# Patient Record
Sex: Female | Born: 1985 | Race: White | Hispanic: No | Marital: Married
Health system: Southern US, Academic
[De-identification: ages and names within clinical notes are randomized; demographics above are authoritative.]

---
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
## 2015-11-19 IMAGING — CR XRAY LOWER LEG LT
1 series · 2 of 2 positions shown · non-contrast
Comparison: None.

Exam:   

Left lower leg 2V
INDICATION: Pain.

[Series 1: view not recorded · 0.17mm/px · 2 of 2 slices shown]
[im 1/2]
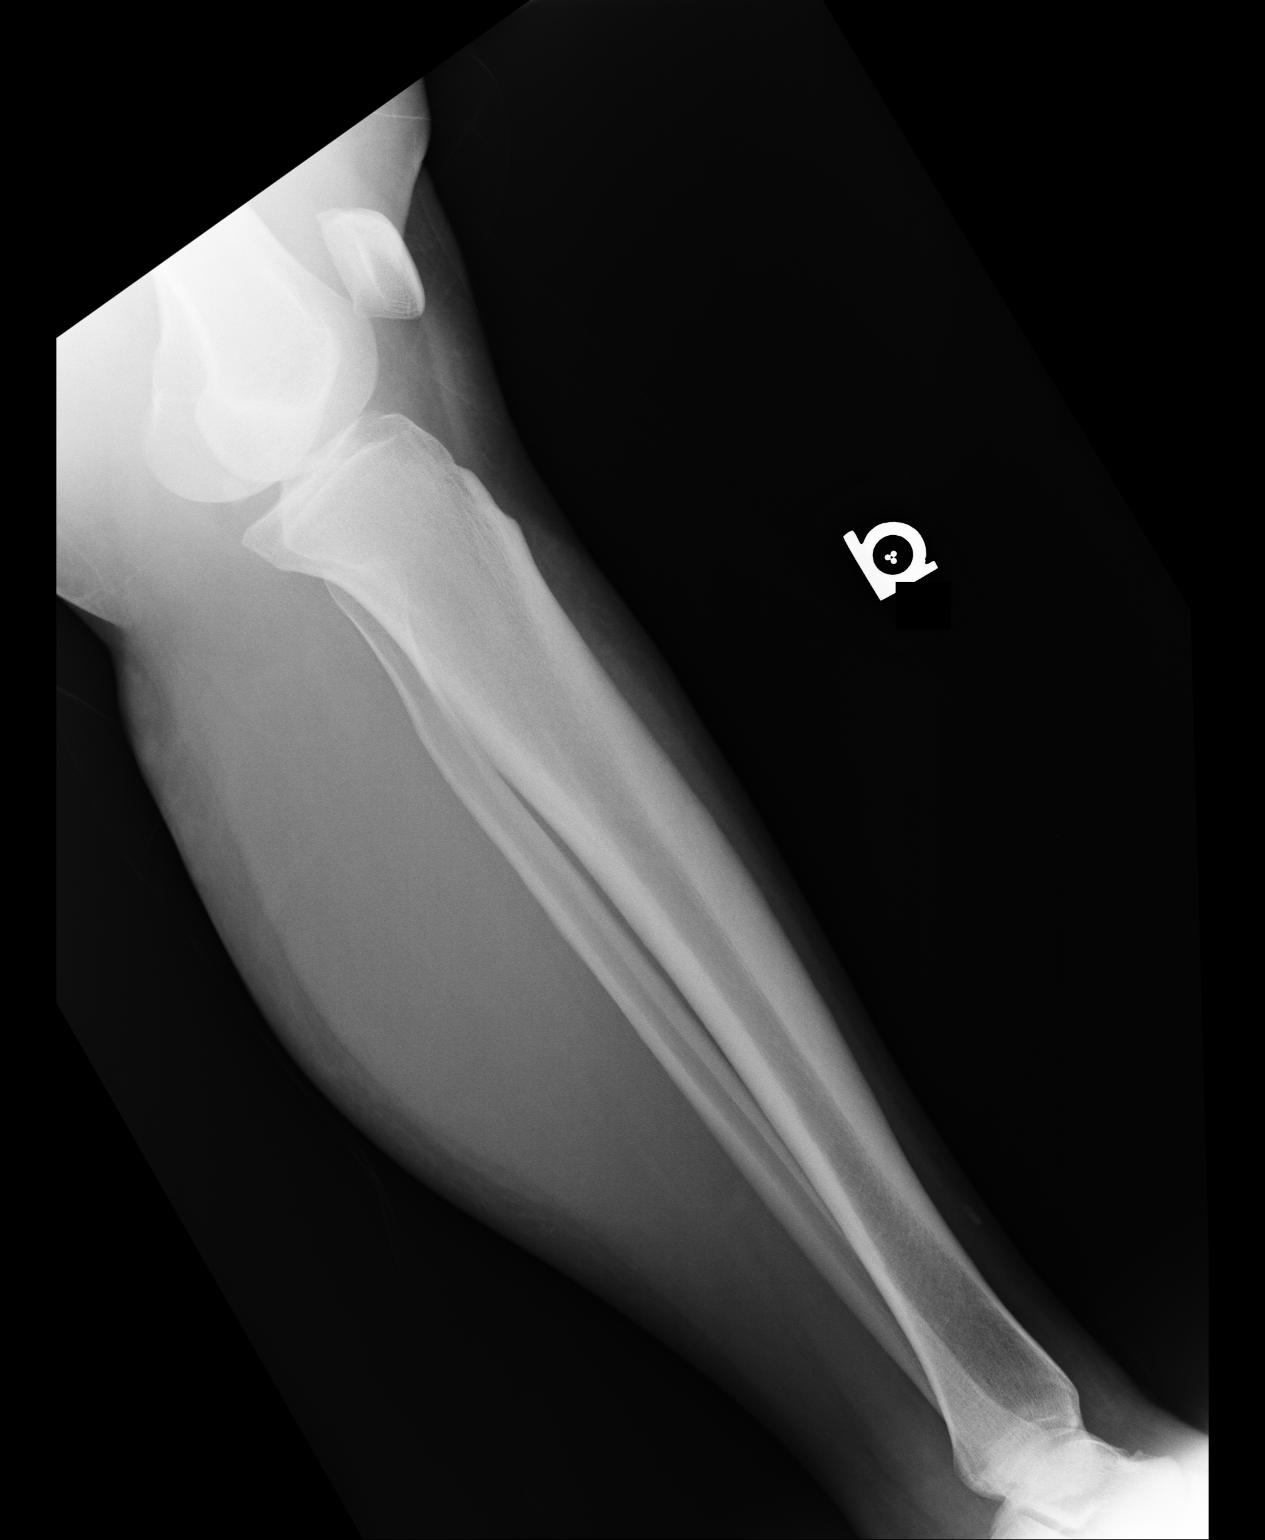
[im 2/2]
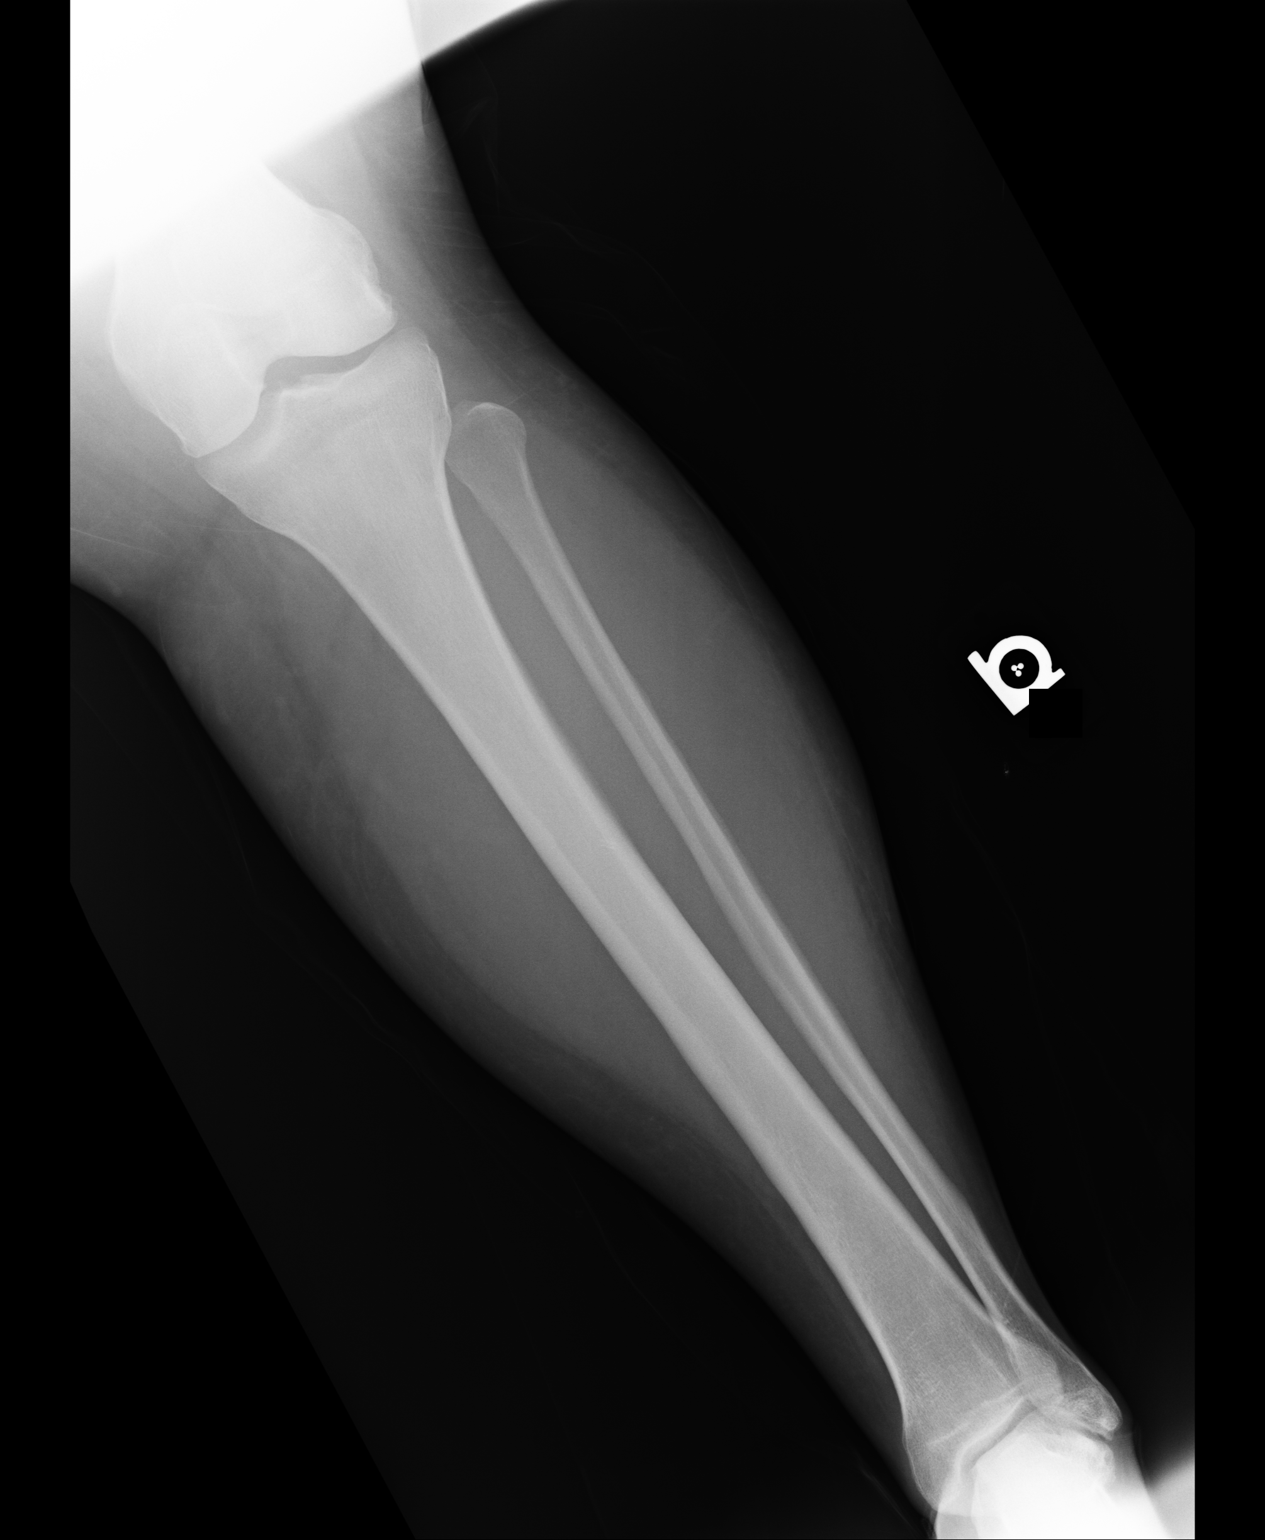

[2 of 2 positions shown; findings below may reference images not displayed]

FINDINGS: The joint spaces are preserved. No fractures are seen.
IMPRESSION: Negative left tibia fibula.

## 2015-11-19 IMAGING — CR XRAY KNEE 3 VIEWS LT
1 series · 3 of 3 positions shown · non-contrast
Comparison: None.

Exam:   

Left knee 4V
INDICATION: Pain.

[Series 3: view not recorded · 0.17mm/px · 3 of 3 slices shown]
[im 1/3]
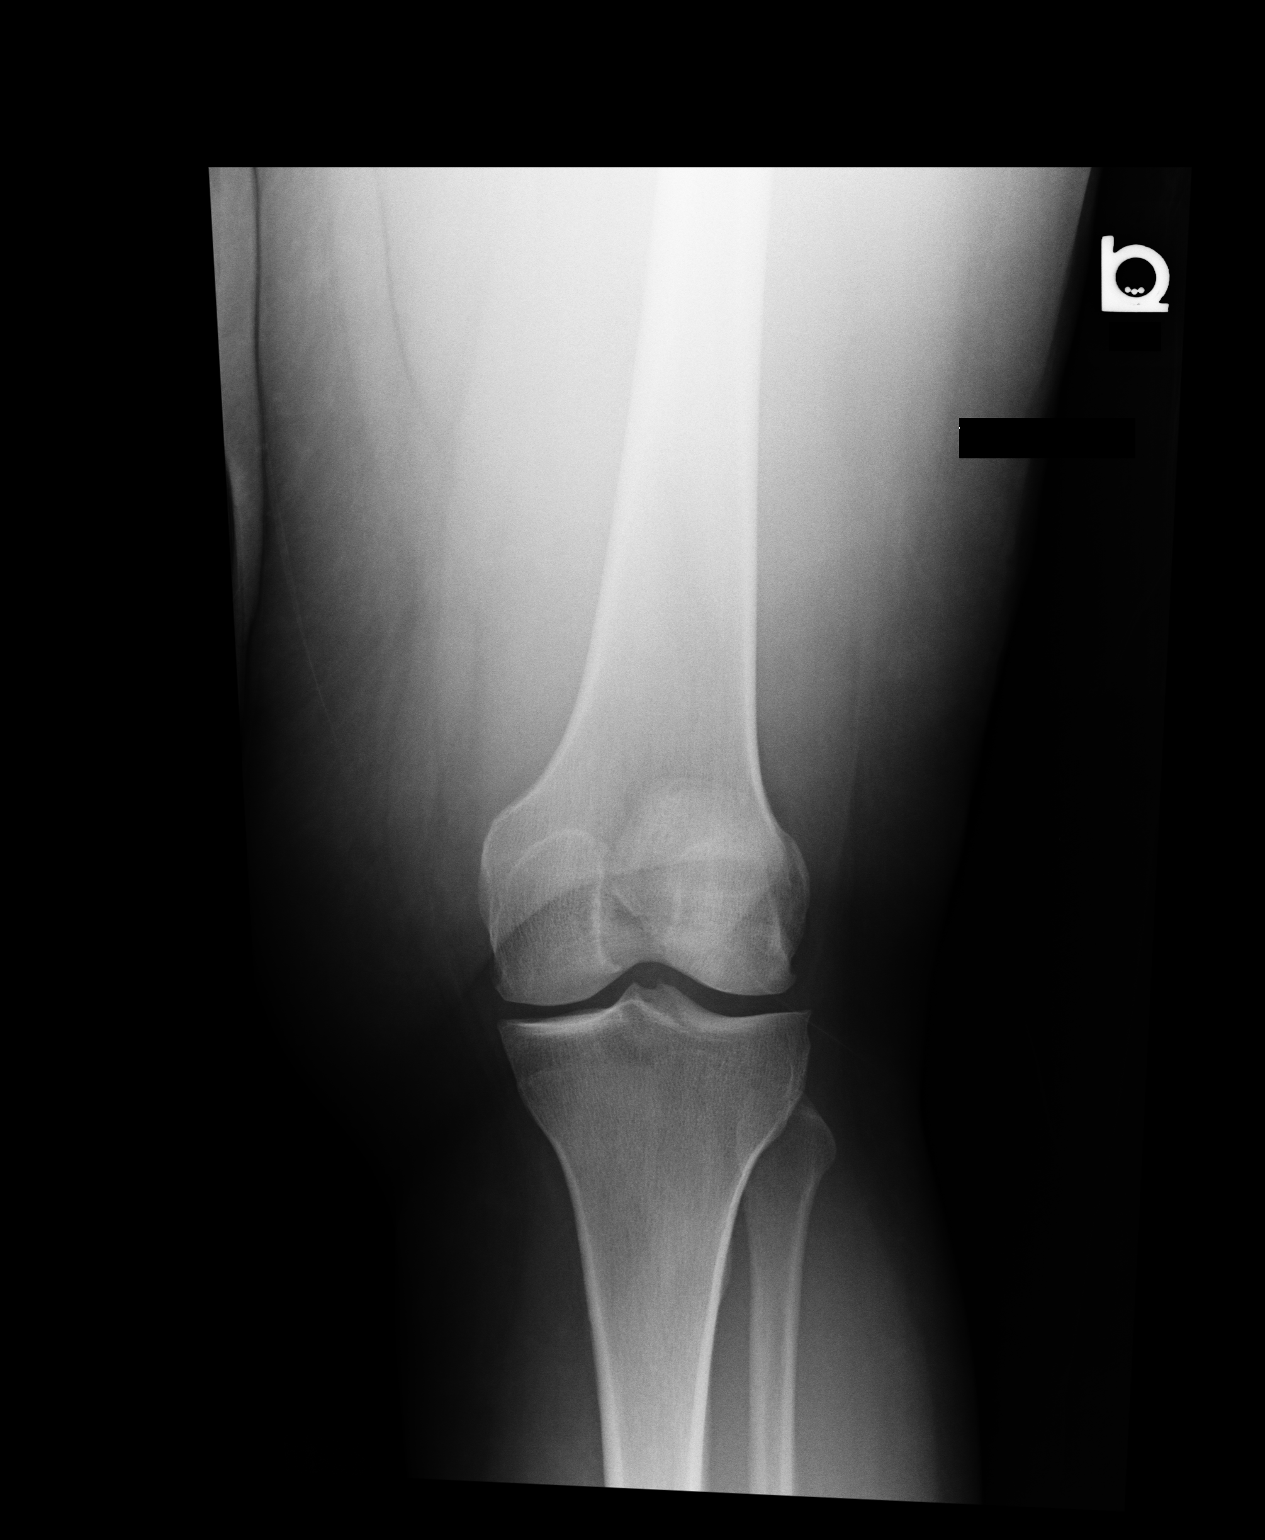
[im 2/3]
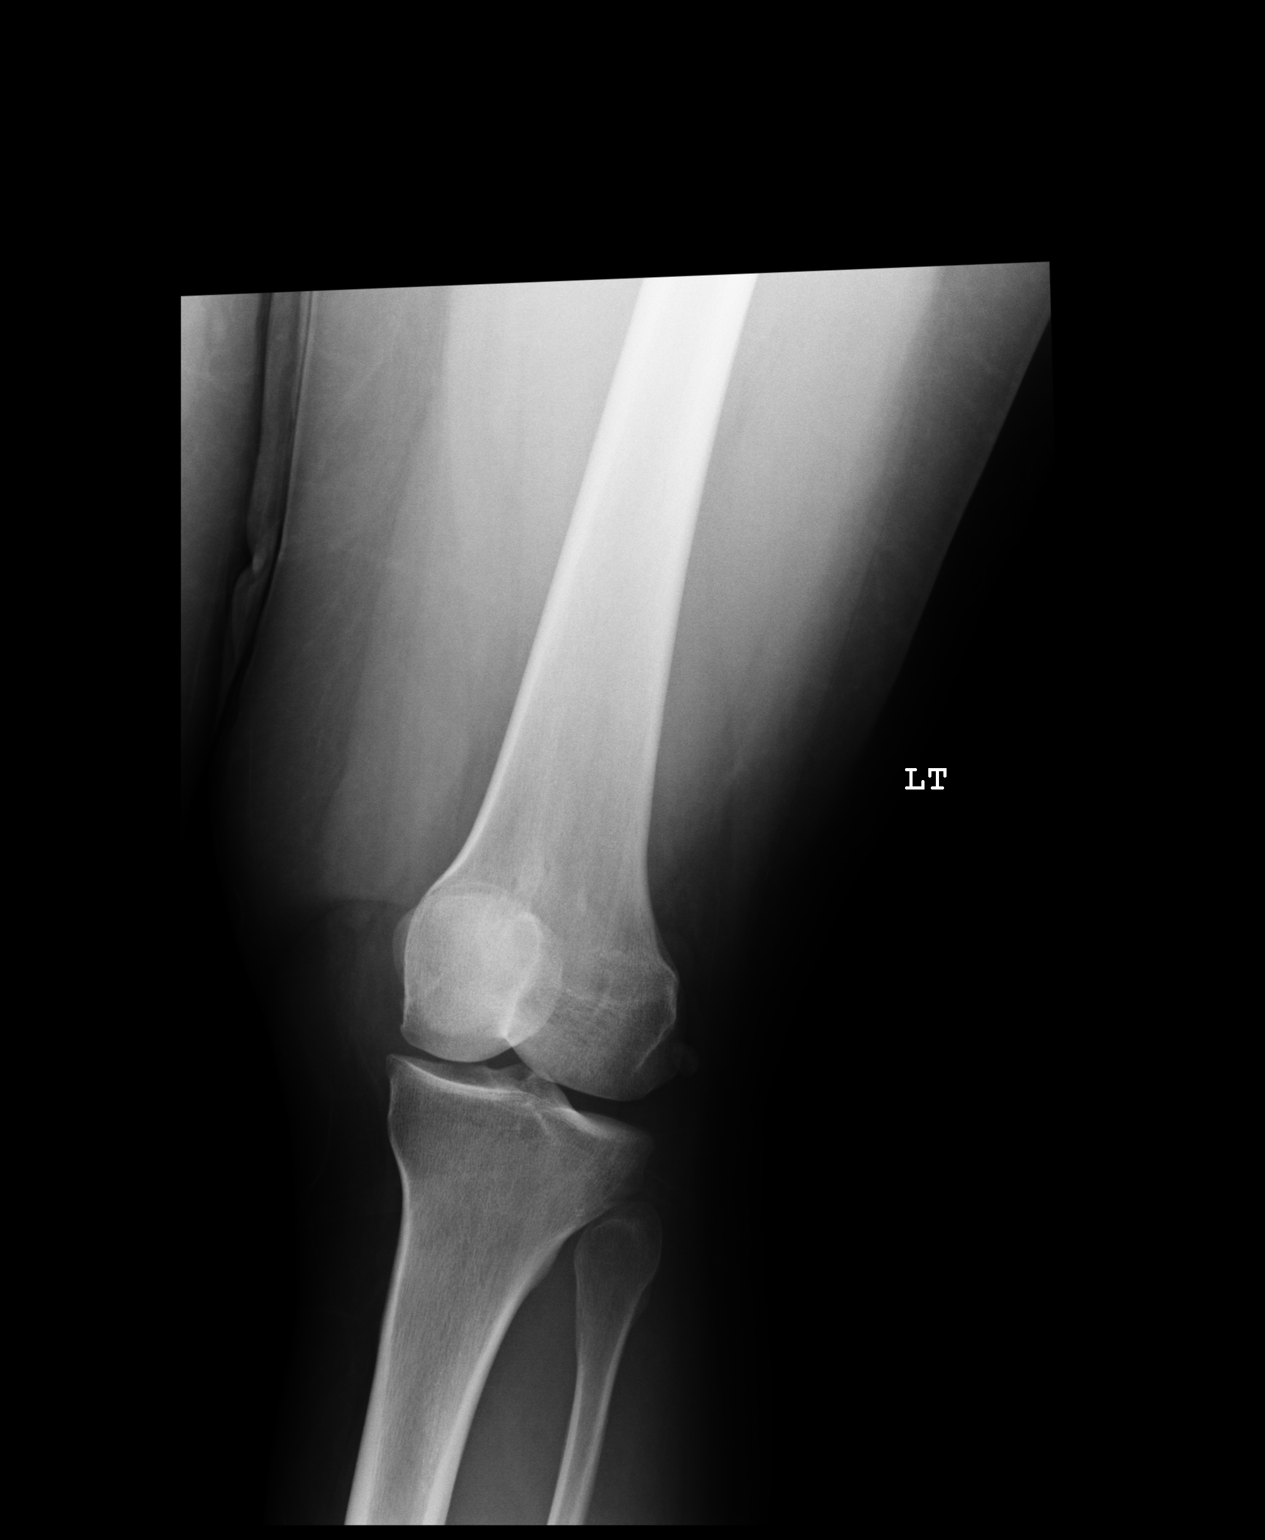
[im 3/3]
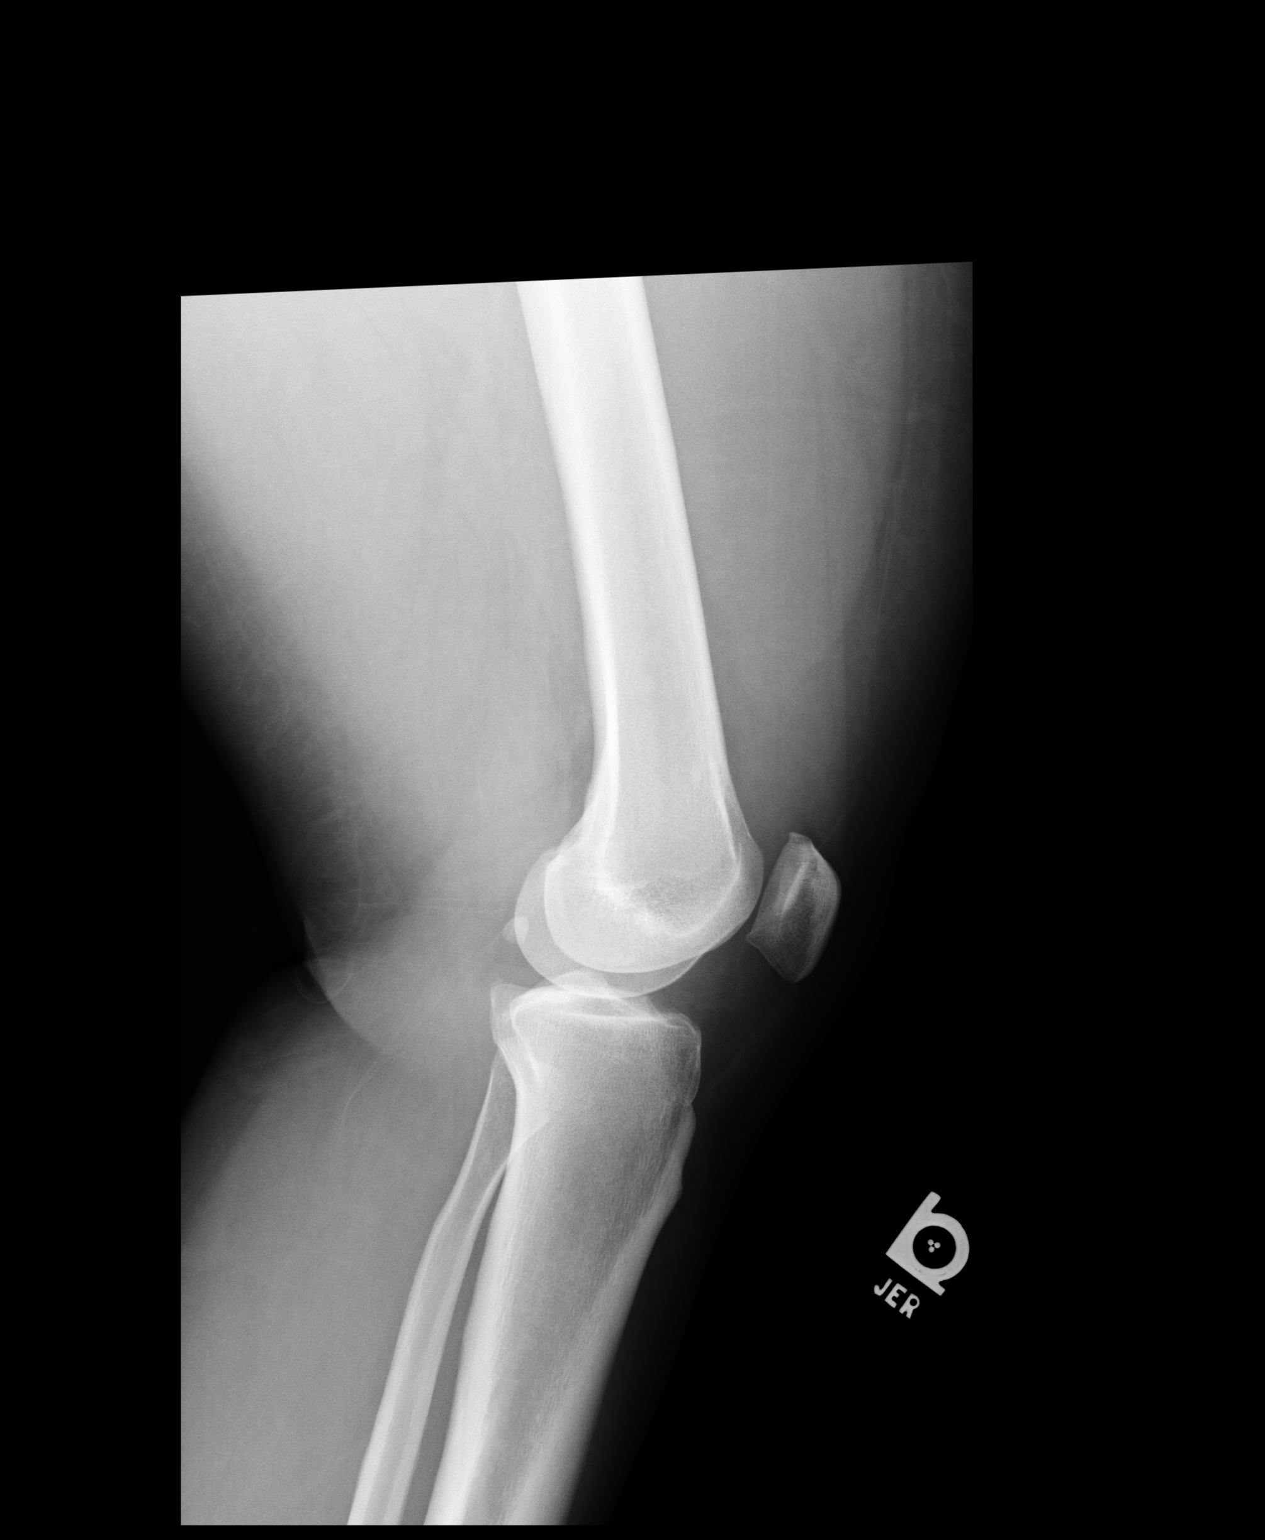

[3 of 3 positions shown; findings below may reference images not displayed]

FINDINGS: The knee is seen in multiple views. No fracture is seen. No joint effusion is appreciated. The joint space is preserved.
IMPRESSION: Negative left knee.

## 2018-04-22 IMAGING — MR MRI BRAIN WITHOUT CONTRAST
8 of 10 series · 32 of 48 positions shown · non-contrast
Comparison: None.

EXAM:  MRI BRAIN WITHOUT CONTRAST
INDICATION: Right leg weakness.
TECHNIQUE: An MRI of the patient’s brain is performed with the acquisition of multiple images in the axial, sagittal and coronal planes.  T1 and T2-weighted scanning protocols are utilized.

[Series 9: DWI · axial · 5.0mm · 1.35mm/px · z∈[-67,+59]mm · 8 of 88 slices shown (1 of 3)]
[im 1/88]
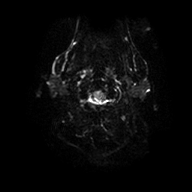
[im 10/88]
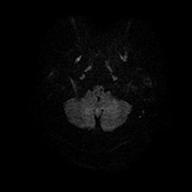
[im 30/88]
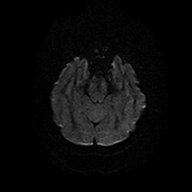
[im 39/88]
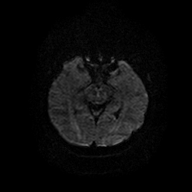
[im 49/88]
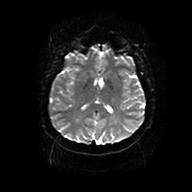
[im 59/88]
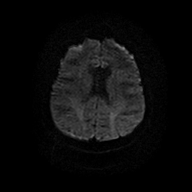
[im 78/88]
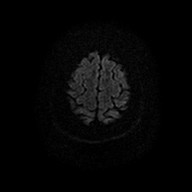
[im 88/88]
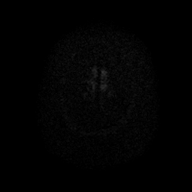

[Series 10: DWI · axial · 5.0mm · 1.35mm/px · z∈[-67,+59]mm · 2 of 22 slices shown (2 of 3)]
[im 1/22]
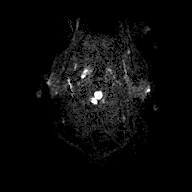
[im 22/22]
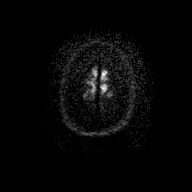

[Series 11: DWI · axial · 5.0mm · 1.35mm/px · z∈[-67,+59]mm · 3 of 22 slices shown (3 of 3)]
[im 1/22]
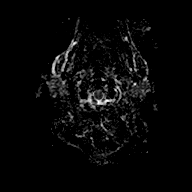
[im 11/22]
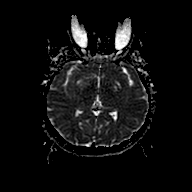
[im 22/22]
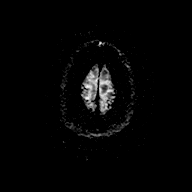

[Series 12: FLAIR · sagittal · 5.0mm · 0.75mm/px · 4 of 28 slices shown (1 of 2)]
[im 1/28]
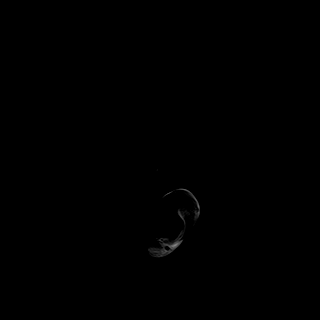
[im 10/28]
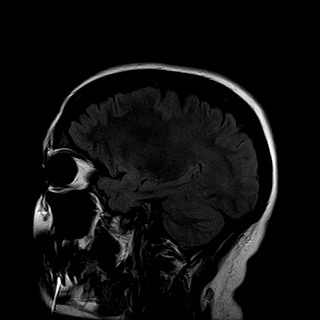
[im 19/28]
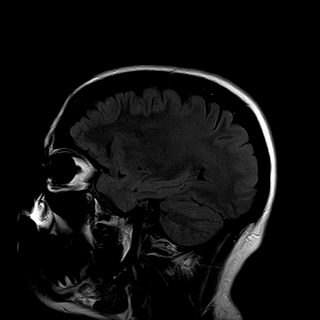
[im 28/28]
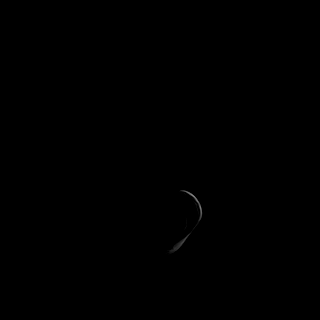

[Series 13: PD · axial · 4.0mm · 0.43mm/px · 1 of 36 slices shown]
[im 1/36]
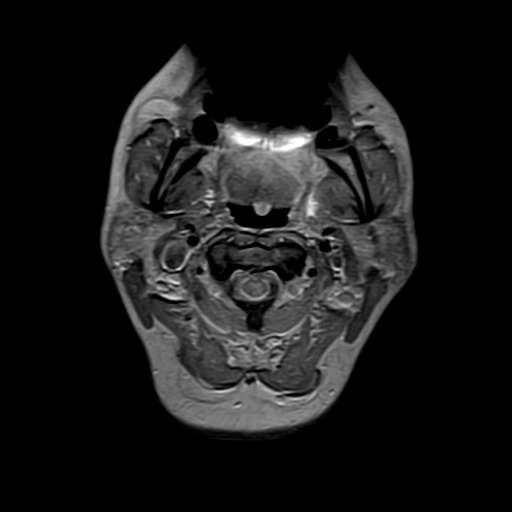

[Series 17: FLAIR · axial · 4.0mm · 0.43mm/px · z∈[-74,+79]mm · 5 of 36 slices shown (2 of 2)]
[im 1/36]
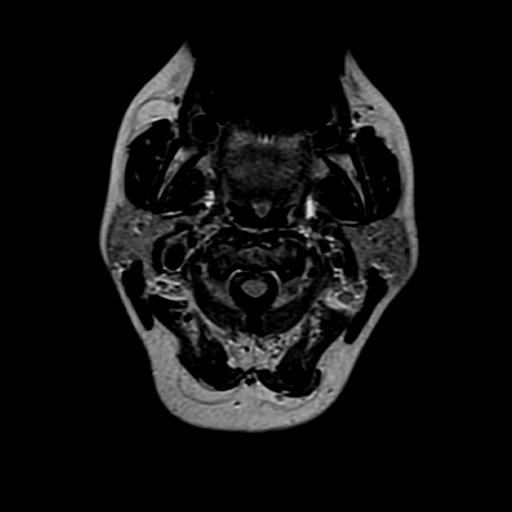
[im 9/36]
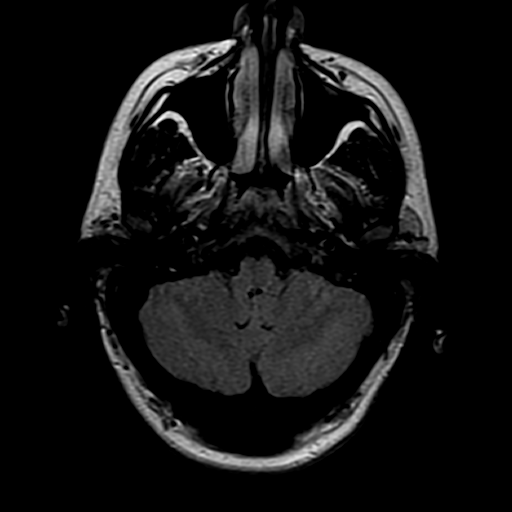
[im 18/36]
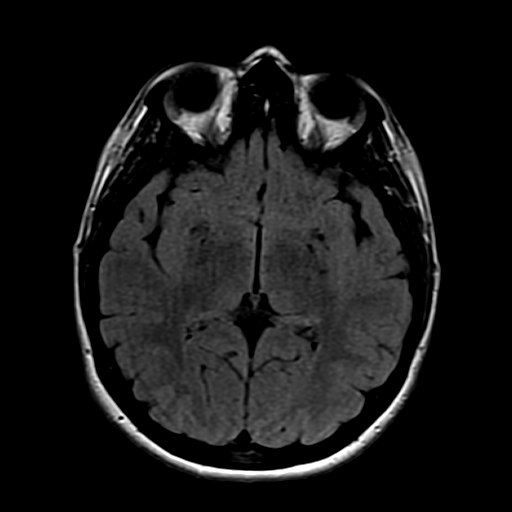
[im 27/36]
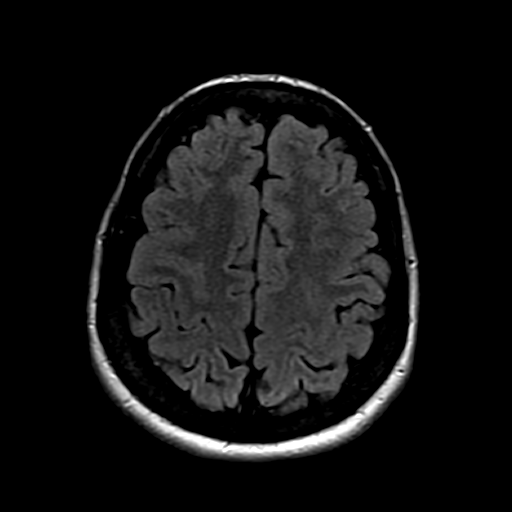
[im 36/36]
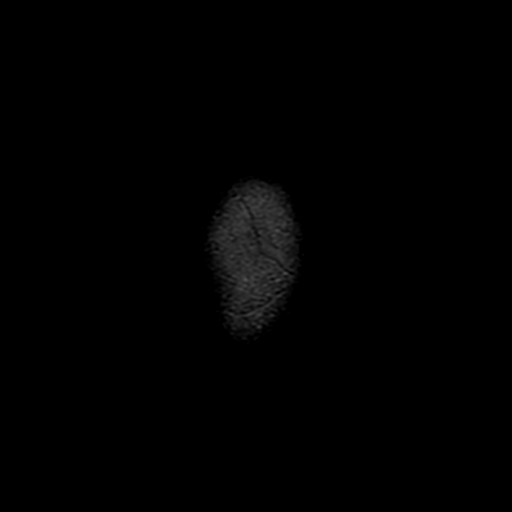

[Series 19: T1 · axial · 4.0mm · 0.43mm/px · z∈[-74,+79]mm · 5 of 36 slices shown]
[im 1/36]
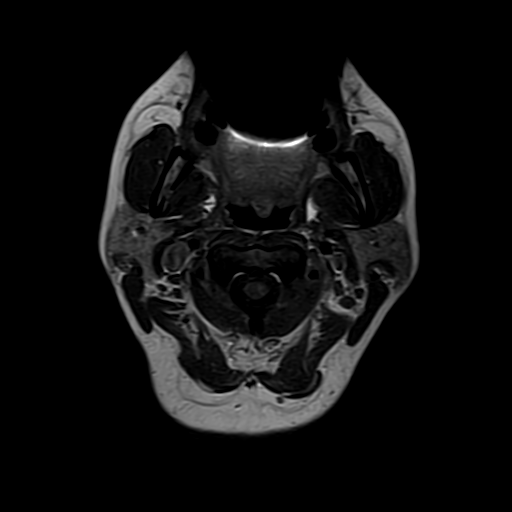
[im 9/36]
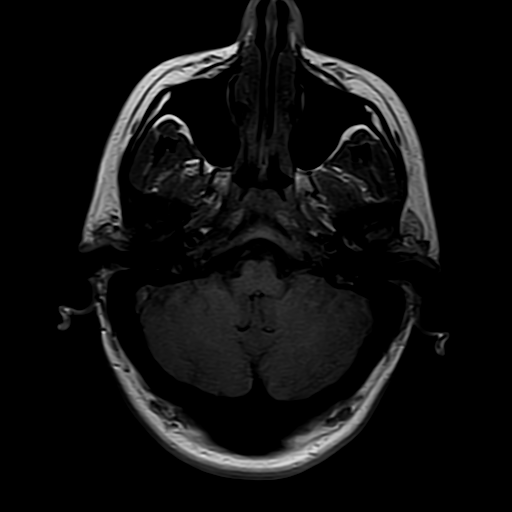
[im 18/36]
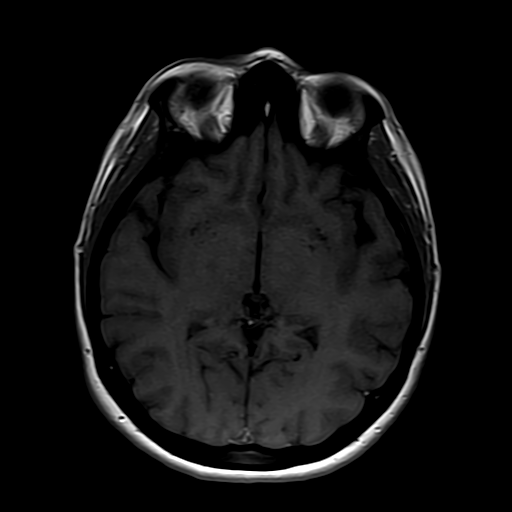
[im 27/36]
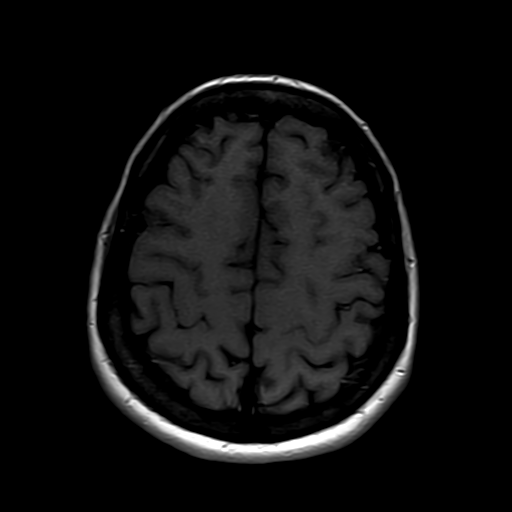
[im 36/36]
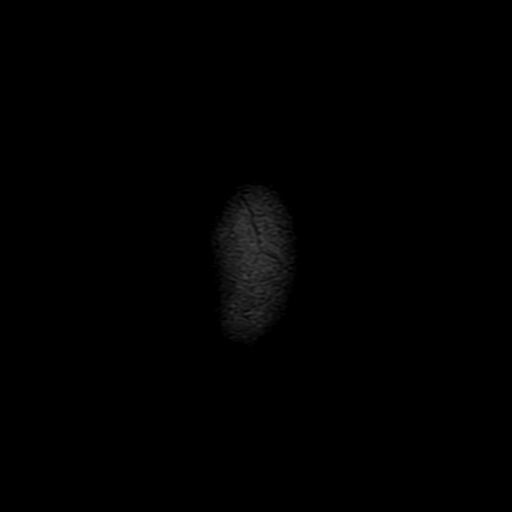

[Series 20: T2 · coronal · 5.0mm · 0.43mm/px · 4 of 28 slices shown]
[im 1/28]
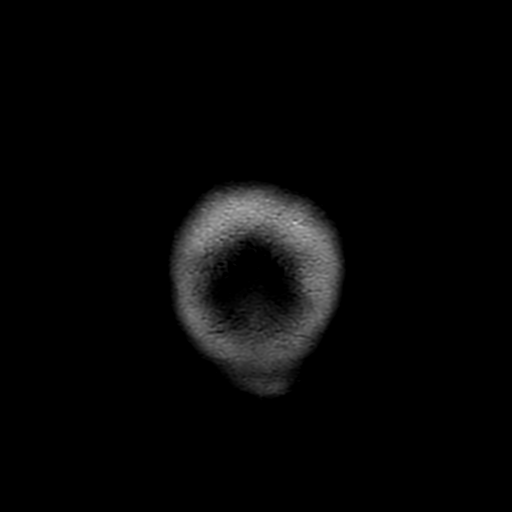
[im 10/28]
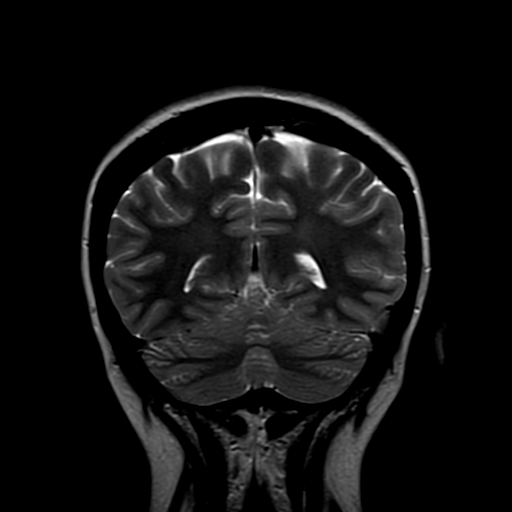
[im 19/28]
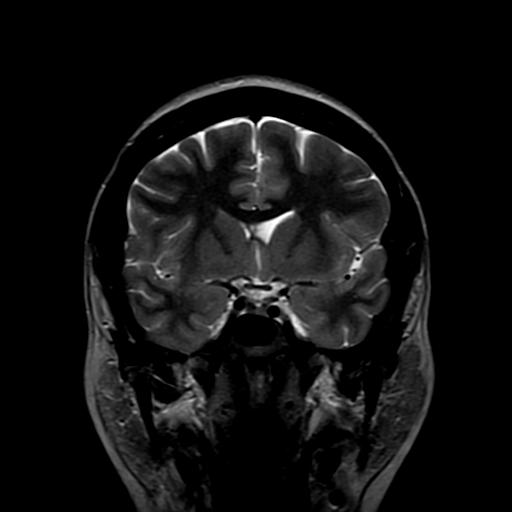
[im 28/28]
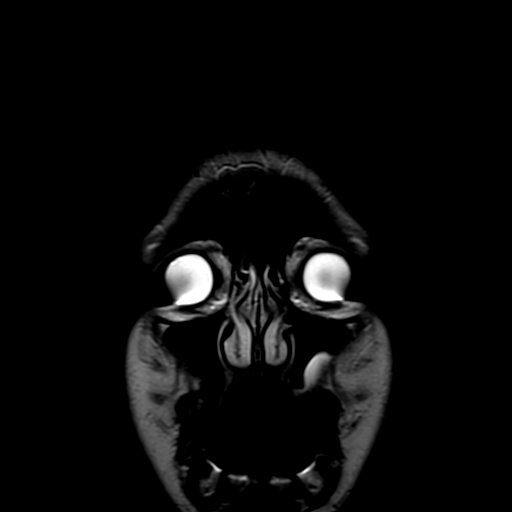

[32 of 48 positions shown; findings below may reference images not displayed]

FINDINGS: The sagittal images show no evidence for atrophy of the corpus callosum.  The cerebellar tonsils are above the level of the foramen magnum.  Diffusion-weighted imaging shows no evidence for restricted diffusion.  The T1 and T2-weighted sagittal, axial and coronal images show no evidence for gliosis in the deep white matter tracts.  There are focal areas in the basal ganglia bilaterally that may represent lacunar infarcts.  No mass effect or midline shift is noted.  The orbits appear unremarkable and symmetric.  An oval lesion is seen in the left maxillary sinus that has a fluid signal and this is compatible with a retention cyst or mucocele.
IMPRESSION: No evidence for restricted diffusion.

Focal lesion in the left maxillary sinus compatible with a mucocele or retention cyst. 

No abnormal signal intensities arising from the white or gray matter.  

No mass effect or midline shift.  

No atrophy of the corpus callosum.  

The internal auditory canals appear symmetric.

## 2019-03-30 IMAGING — CR XRAY LUMBAR SPINE COMPLETE
1 series · 5 of 5 positions shown · non-contrast
Comparison: Radiographs dated 02/06/2014.

EXAM:  XRAY LUMBAR SPINE COMPLETE
INDICATION: Lower back pain.

[Series 3: view not recorded · 0.17mm/px · 5 of 5 slices shown]
[im 1/5]
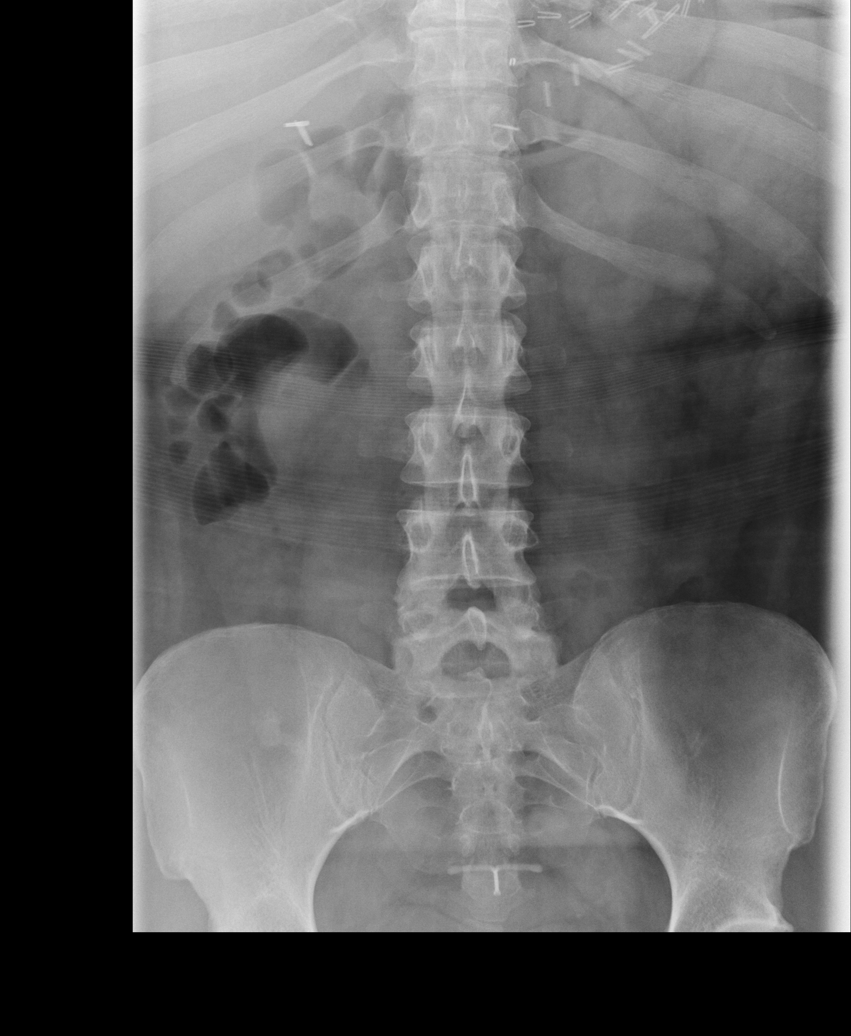
[im 2/5]
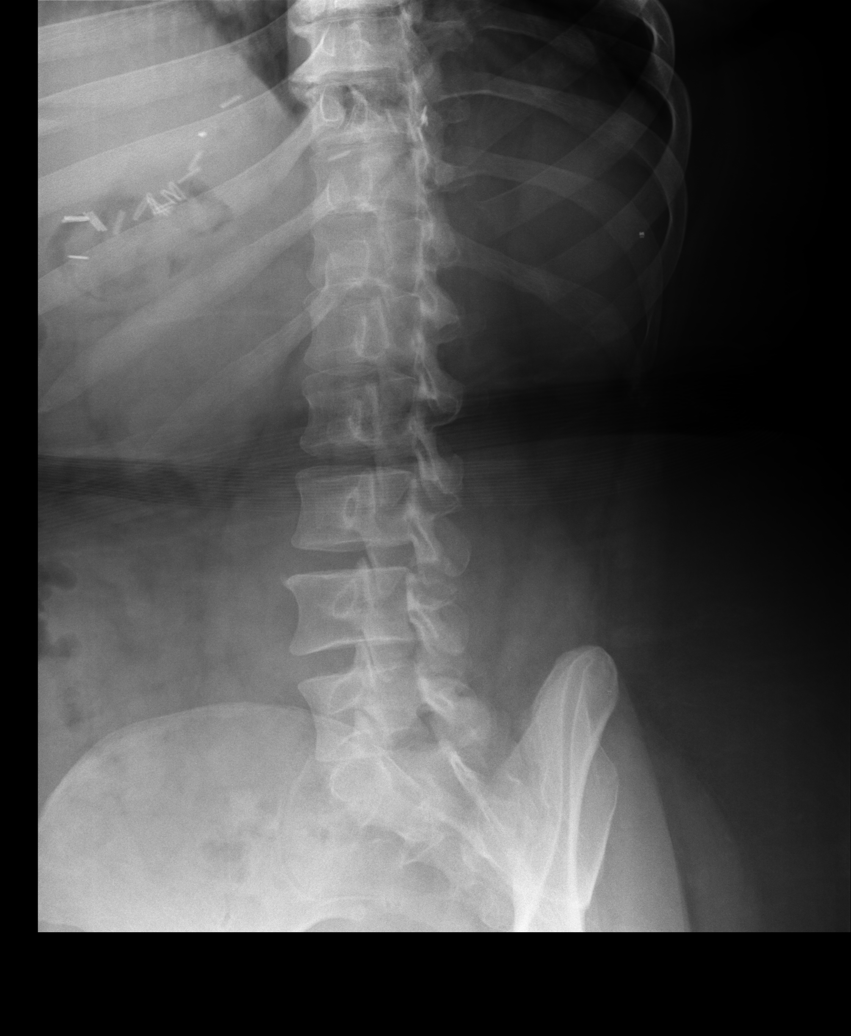
[im 3/5]
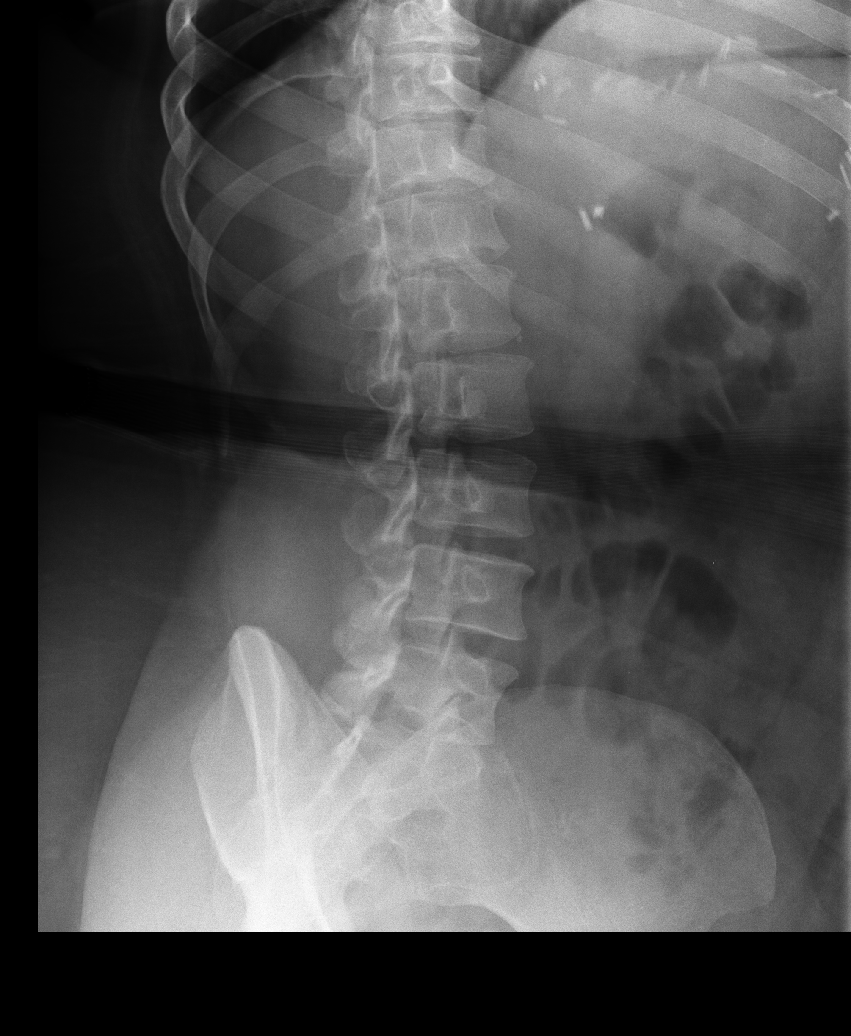
[im 4/5]
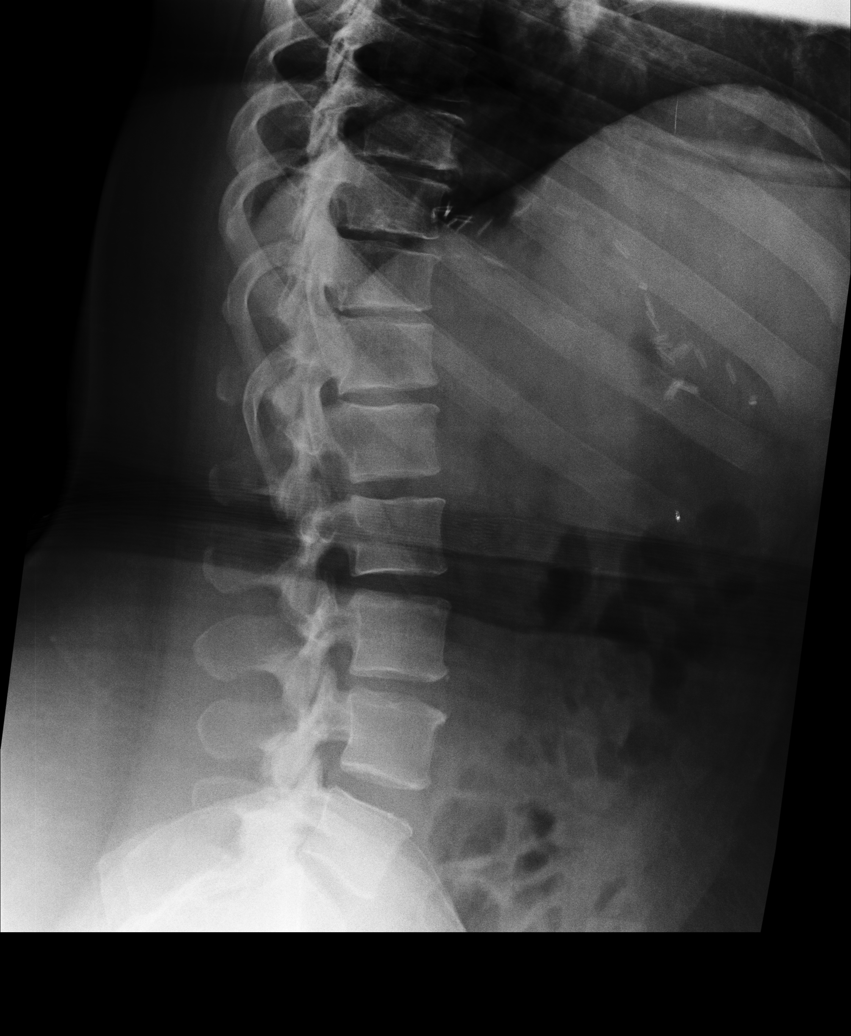
[im 5/5]
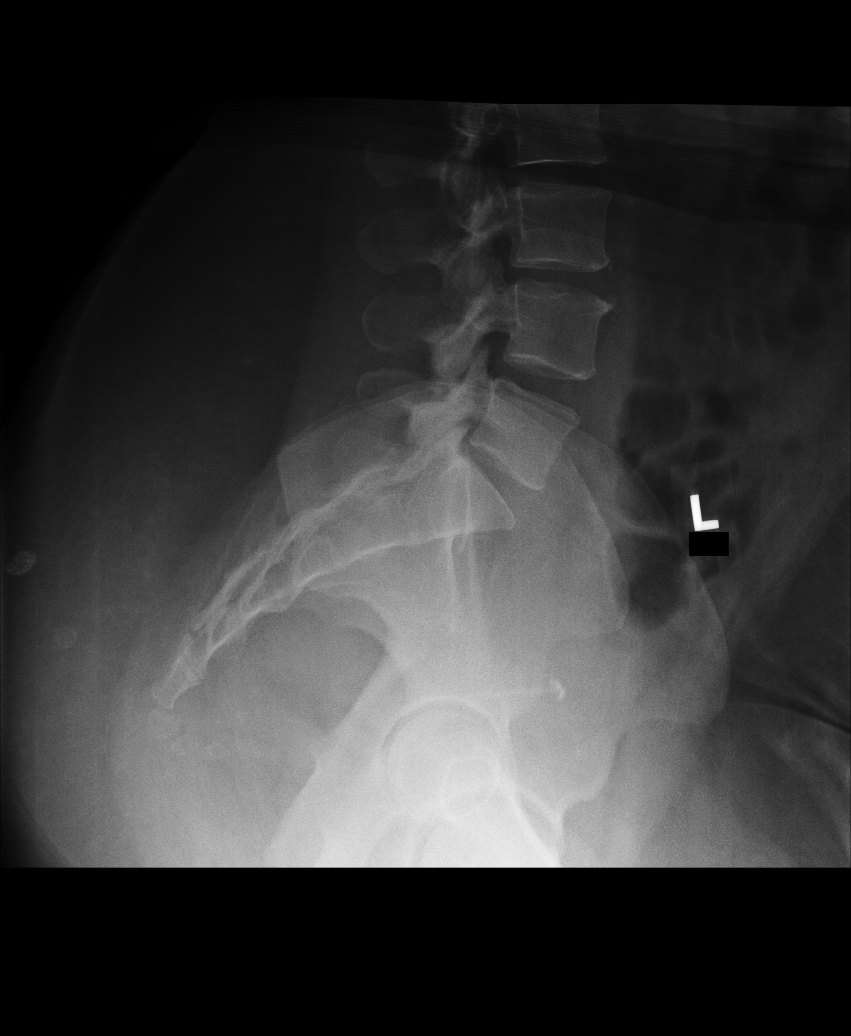

[5 of 5 positions shown; findings below may reference images not displayed]

FINDINGS: Vertebral bodies are normal in height and alignment. There is no acute fracture or subluxation. Mild degenerative disc disease at L3-4 level is again noted. There is also mild facet arthropathy within the lower lumbar spine. Paraspinal soft tissues are unremarkable. Surgical staples within the upper abdomen are again identified.
IMPRESSION: Stable mild degenerative changes.

## 2019-03-30 IMAGING — CR XRAY HIP W/PELVIS BIL 2 VIEWS
1 series · 3 of 3 positions shown · non-contrast
Comparison: Pelvis radiograph dated 02/06/2014.

EXAM:  XRAY HIP W/PELVIS BIL 2 VIEWS
INDICATION: Bilateral hip pain.

[Series 1: view not recorded · 0.17mm/px · 3 of 3 slices shown]
[im 1/3]
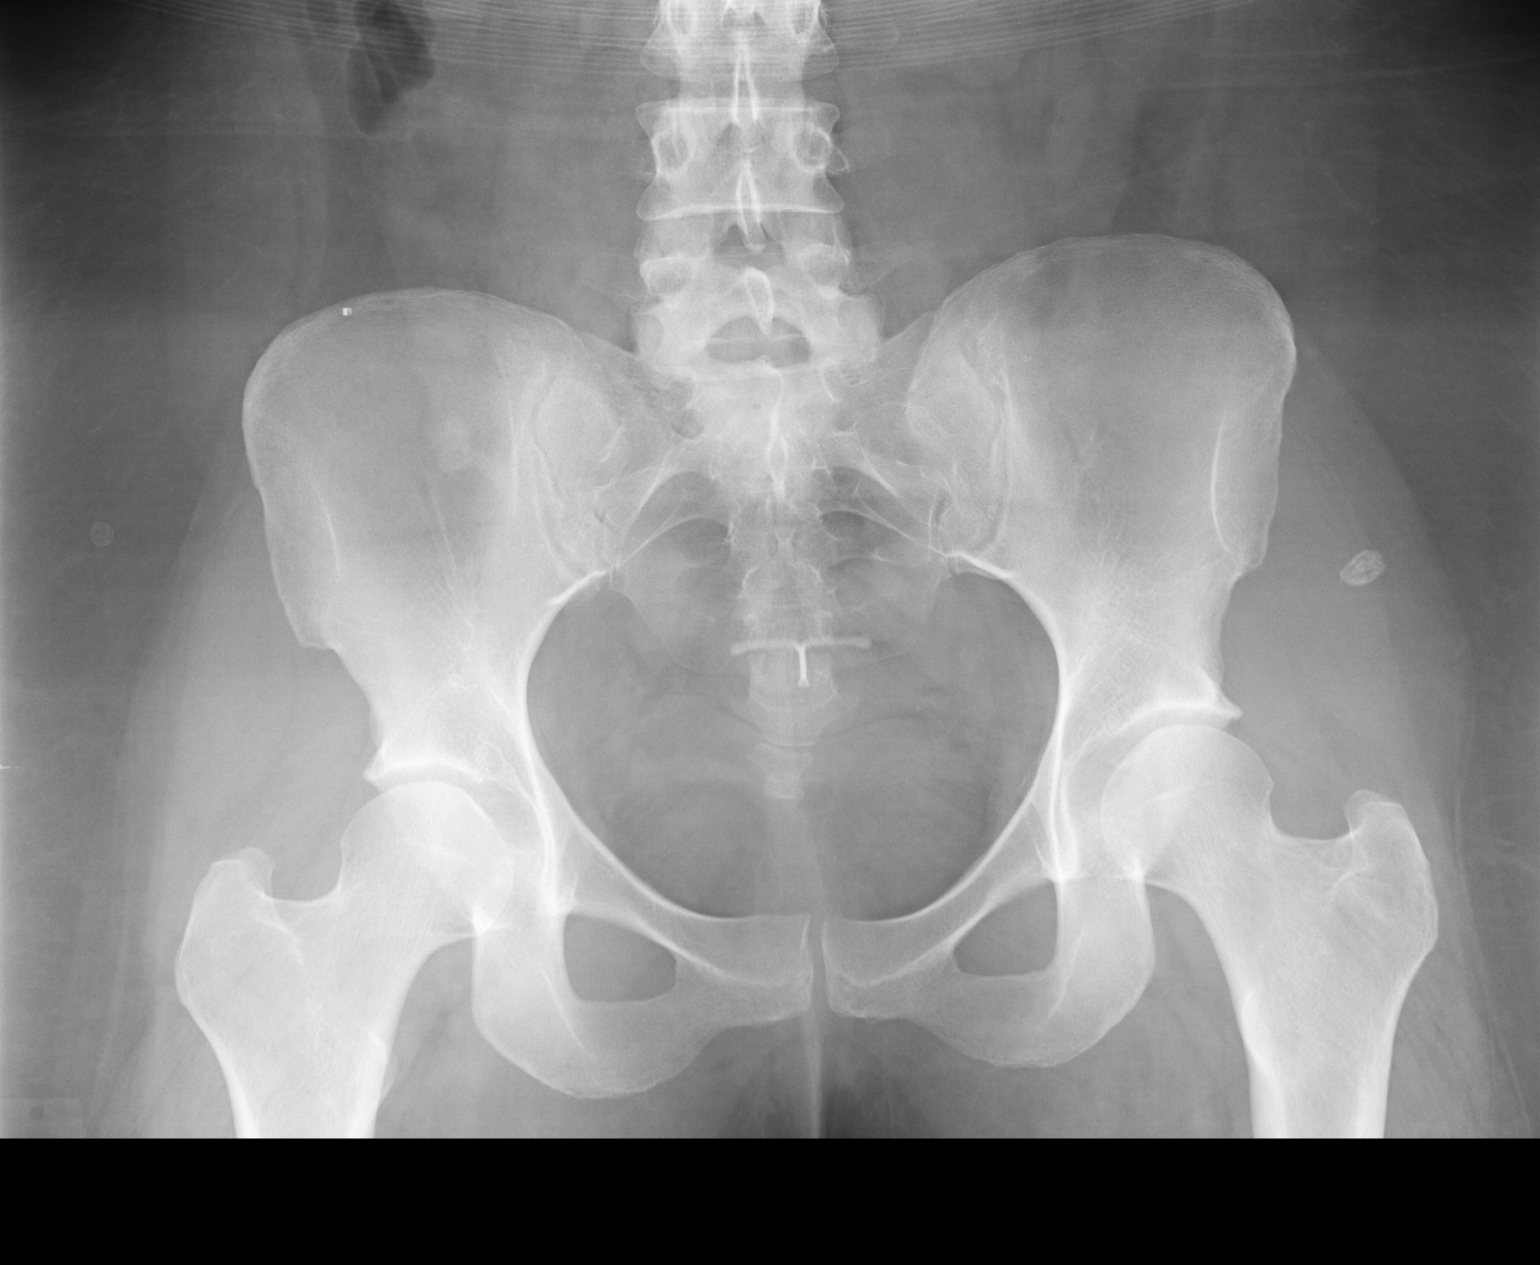
[im 2/3]
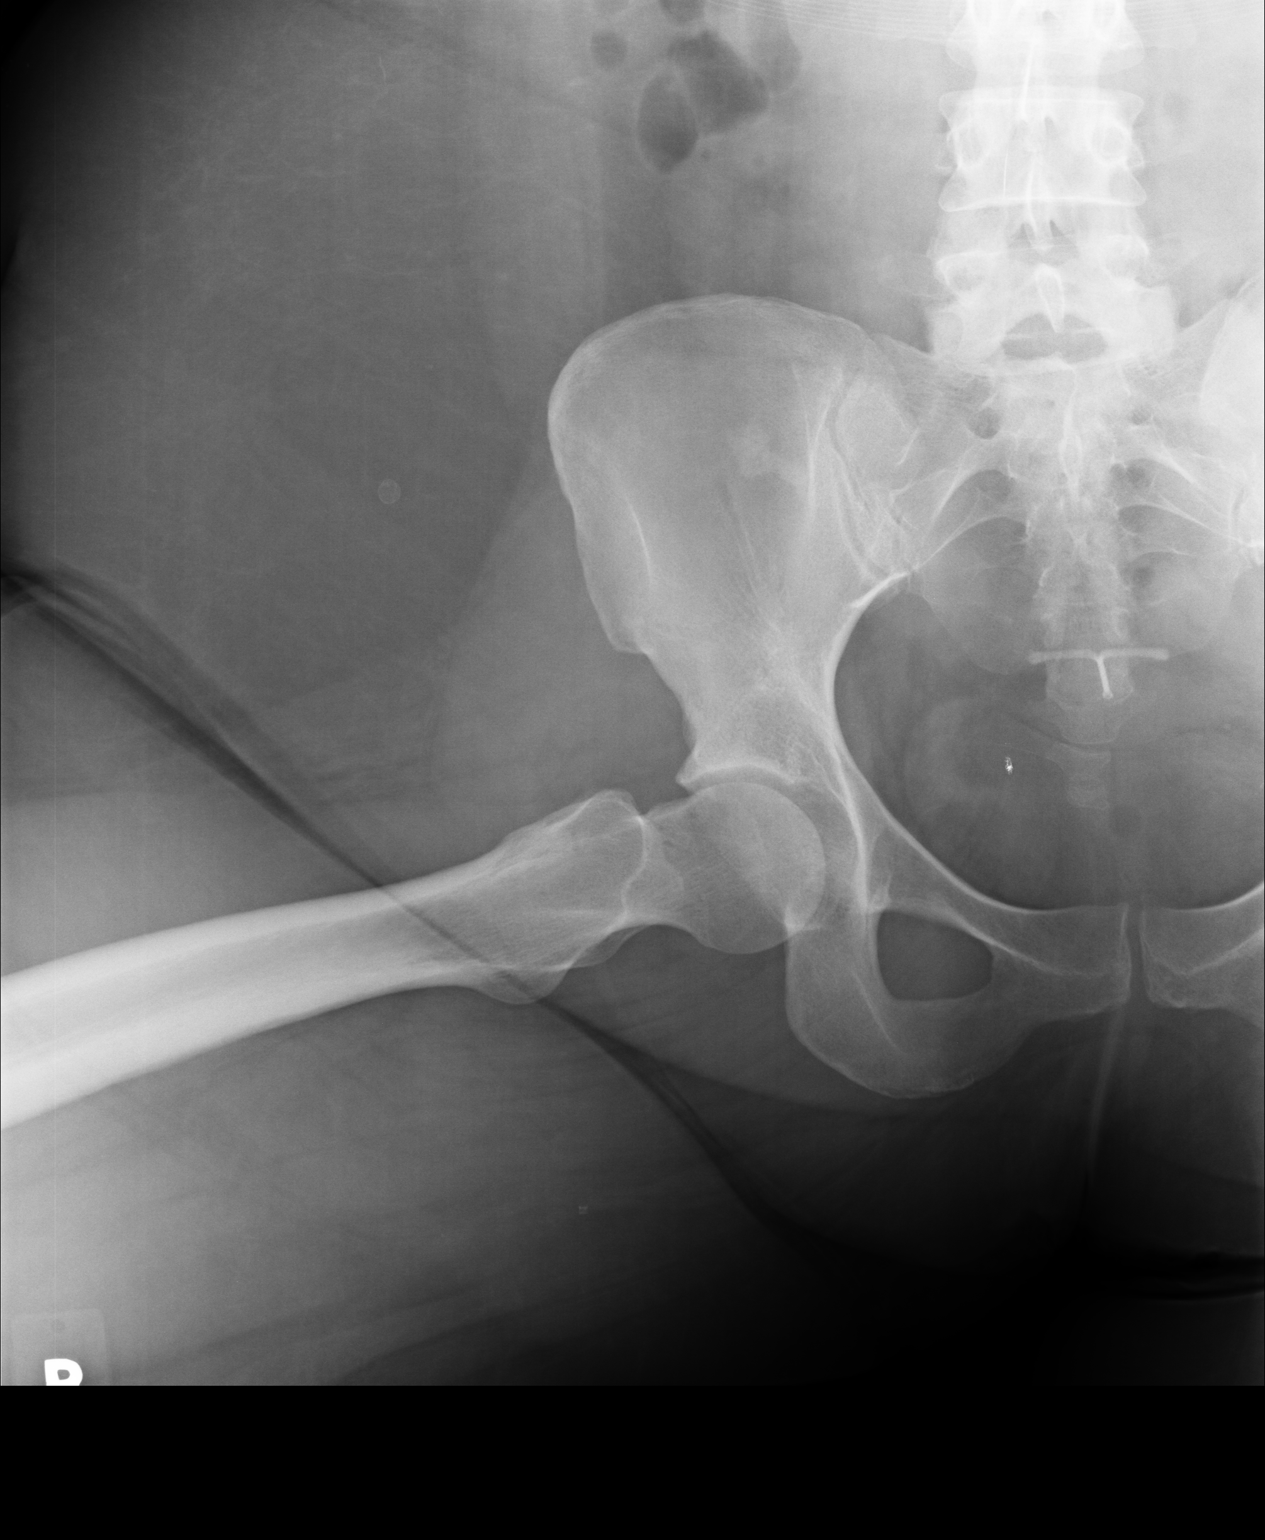
[im 3/3]
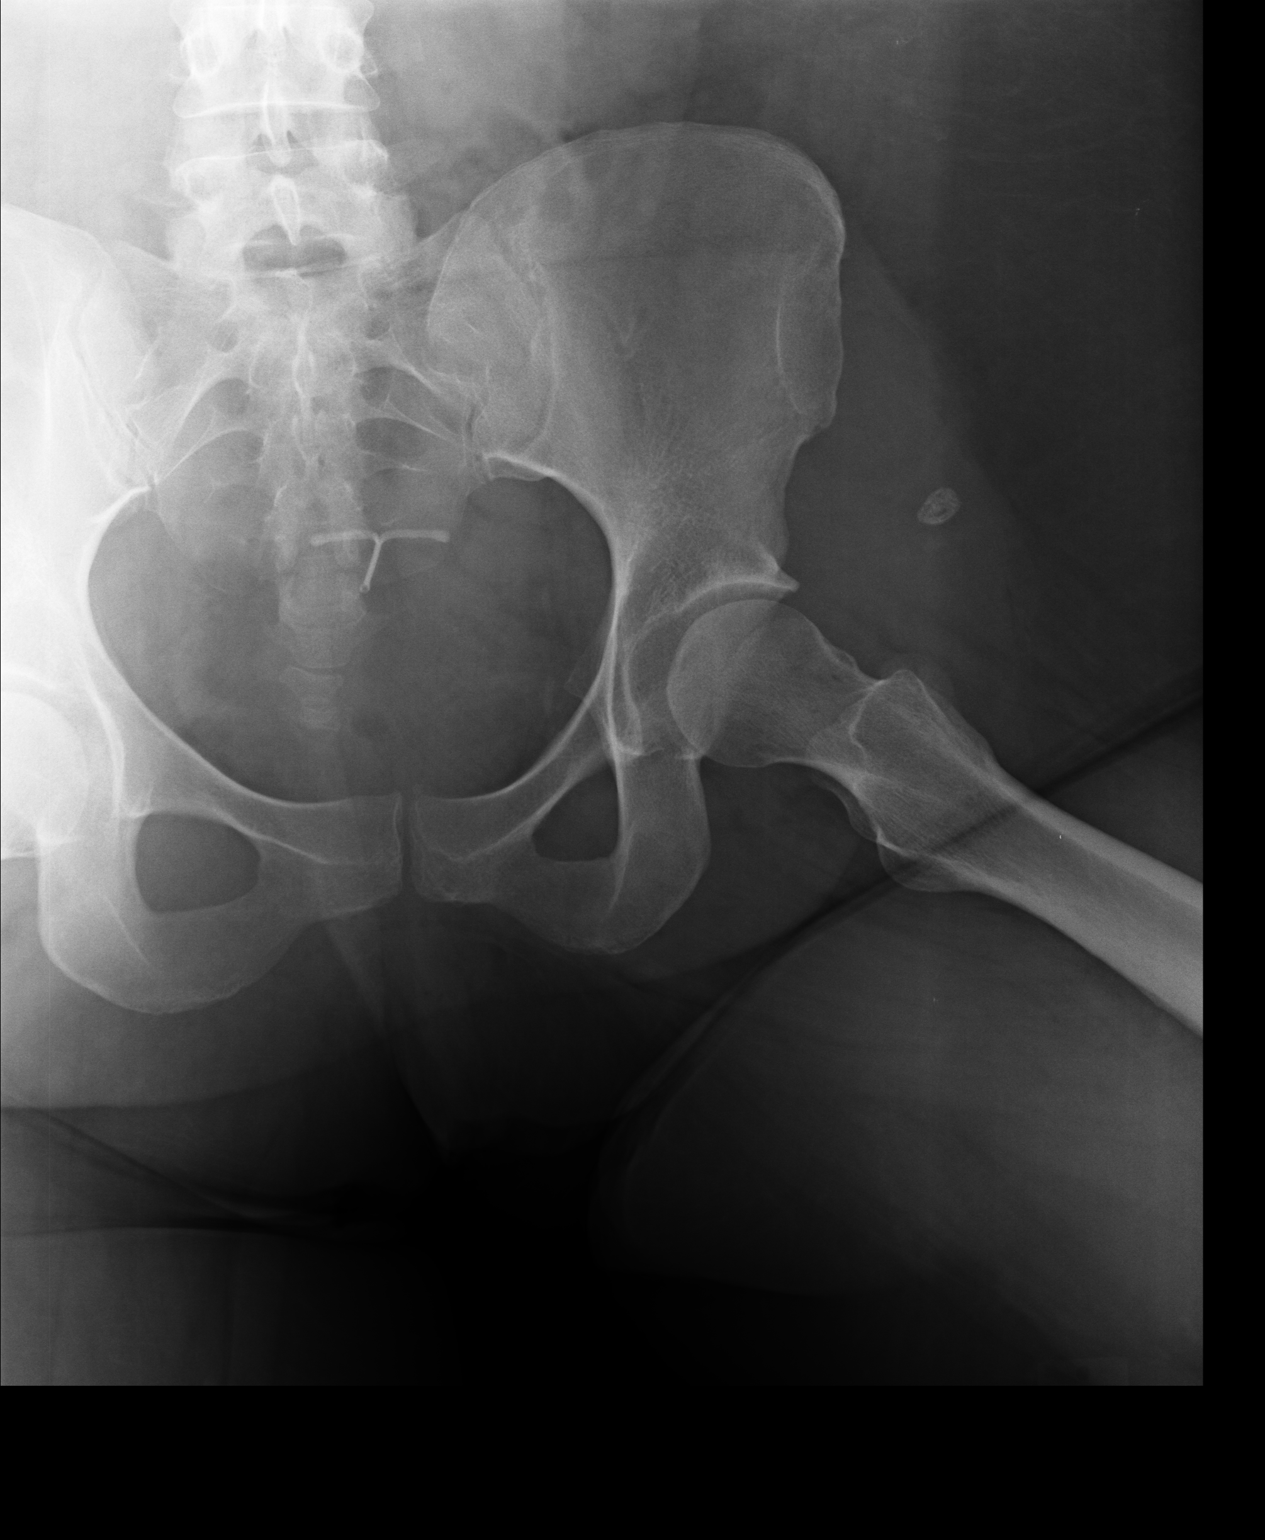

[3 of 3 positions shown; findings below may reference images not displayed]

FINDINGS: There is no acute fracture or subluxation. No significant arthritic changes are seen. Intrauterine contraceptive device is noted.
IMPRESSION: Unremarkable exam.

## 2021-04-21 ENCOUNTER — Other Ambulatory Visit (INDEPENDENT_AMBULATORY_CARE_PROVIDER_SITE_OTHER): Payer: Self-pay | Admitting: Family

## 2021-04-21 ENCOUNTER — Other Ambulatory Visit: Payer: Self-pay

## 2021-04-21 ENCOUNTER — Inpatient Hospital Stay
Admission: RE | Admit: 2021-04-21 | Discharge: 2021-04-21 | Disposition: A | Discharge status: Home / Self Care | Payer: Managed Care, Other (non HMO) | Source: Ambulatory Visit | Attending: Family | Admitting: Family

## 2021-04-21 DIAGNOSIS — M25562 Pain in left knee: Secondary | ICD-10-CM

## 2021-04-23 ENCOUNTER — Other Ambulatory Visit (HOSPITAL_COMMUNITY): Payer: Self-pay | Admitting: Emergency Medicine

## 2021-04-23 DIAGNOSIS — M25562 Pain in left knee: Secondary | ICD-10-CM

## 2021-04-29 ENCOUNTER — Encounter (HOSPITAL_COMMUNITY): Payer: Self-pay

## 2021-04-30 IMAGING — MR MRI KNEE LT W/O CONTRAST
5 series · 37 of 40 positions shown · IV contrast (gadolinium)
Comparison: Radiographs dated 11/19/2015.

﻿EXAM:  [id number]   MRI KNEE LT W/O CONTRAST
INDICATION: Medial side pain.
TECHNIQUE: Multiplanar multisequential MRI of the left knee joint was performed without gadolinium contrast.

[Series 5: PD fat-sat · axial · left · 4.0mm · 0.37mm/px · z∈[-82,+31]mm · 7 of 26 slices shown (1 of 3)]
[im 1/26]
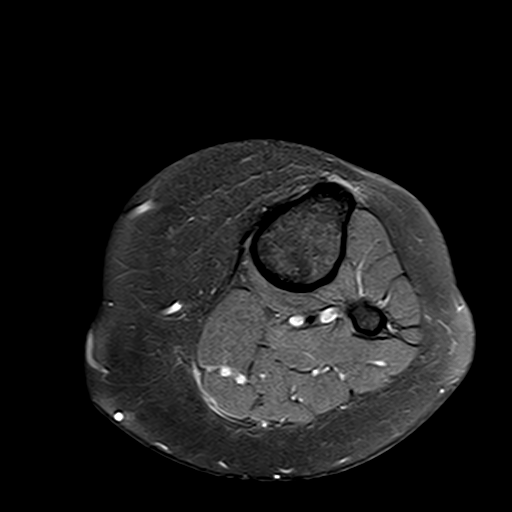
[im 5/26]
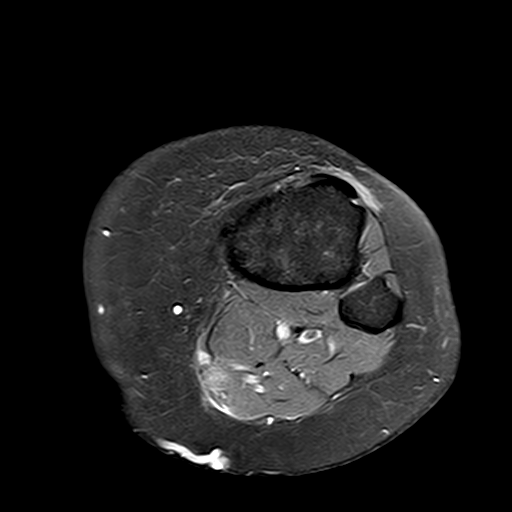
[im 9/26]
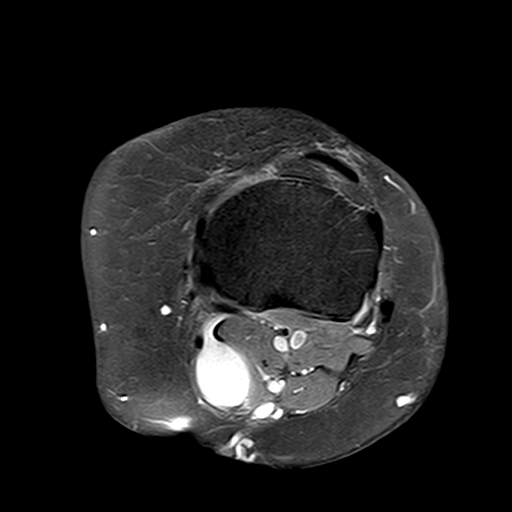
[im 13/26]
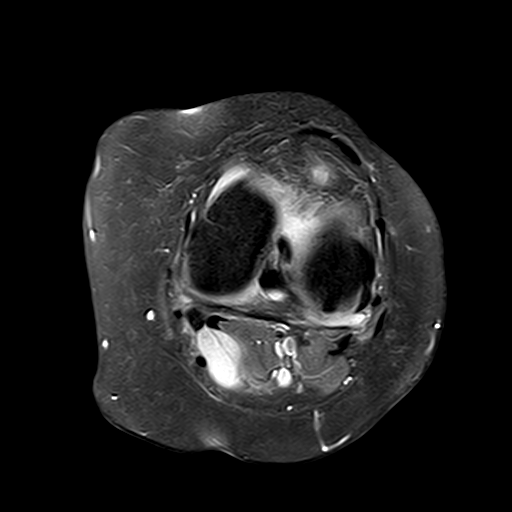
[im 17/26]
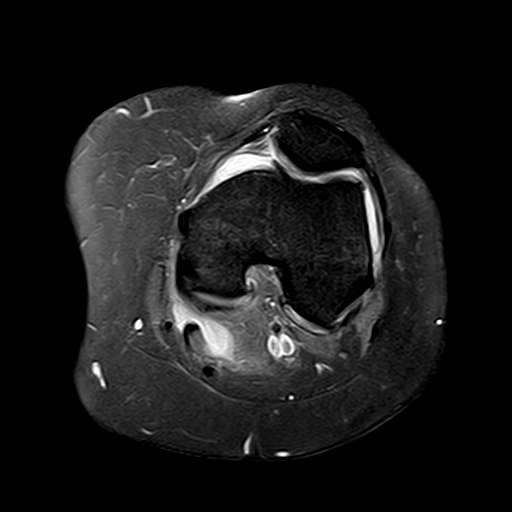
[im 21/26]
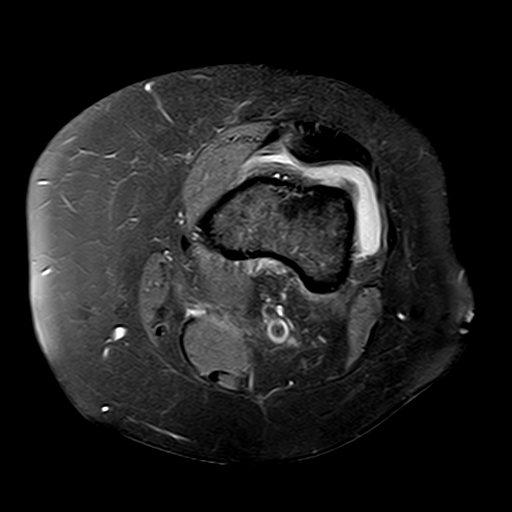
[im 26/26]
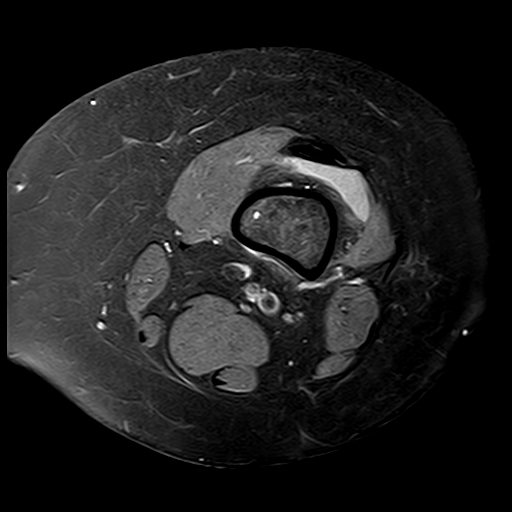

[Series 6: PD fat-sat · sagittal · left · 3.5mm · 0.59mm/px · 7 of 26 slices shown (2 of 3)]
[im 1/26]
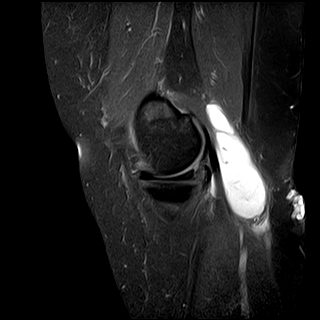
[im 5/26]
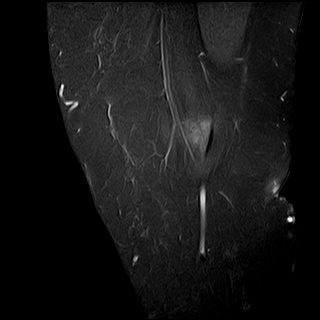
[im 9/26]
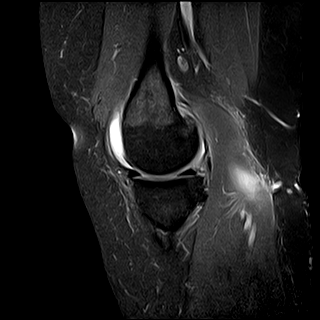
[im 13/26]
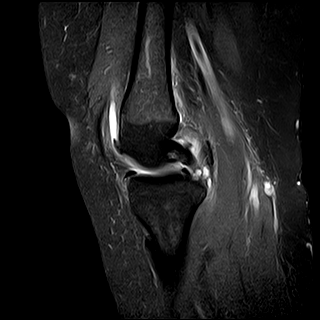
[im 17/26]
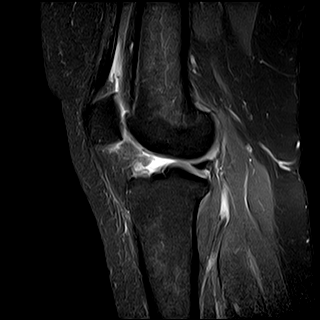
[im 21/26]
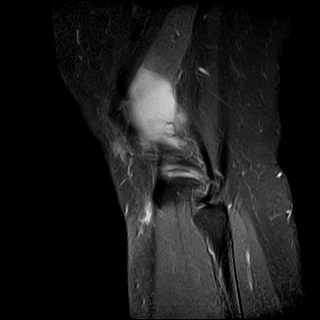
[im 26/26]
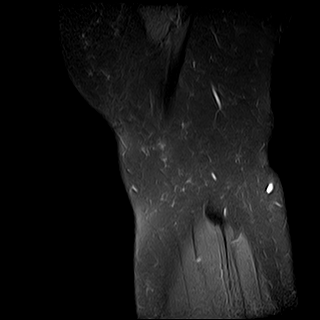

[Series 7: T1 · sagittal · left · 3.5mm · 0.59mm/px · 8 of 26 slices shown]
[im 1/26]
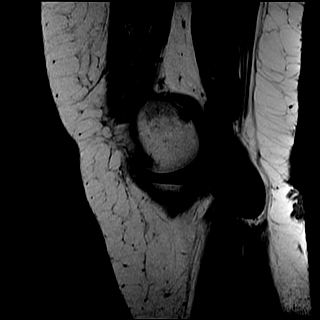
[im 4/26]
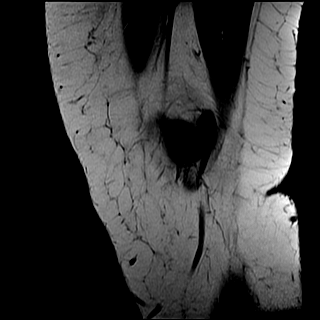
[im 8/26]
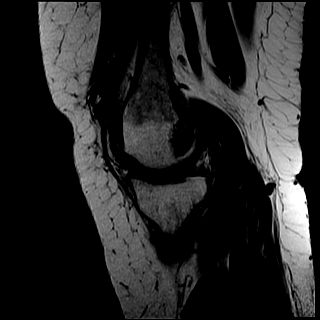
[im 11/26]
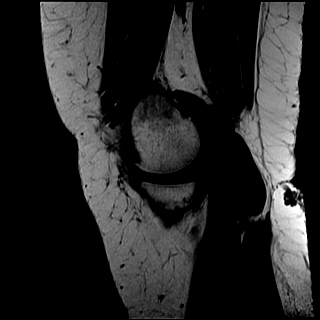
[im 15/26]
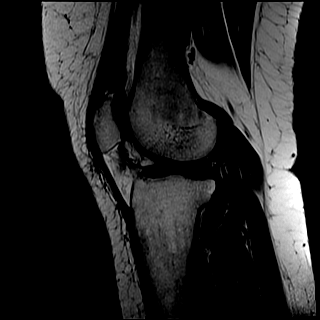
[im 18/26]
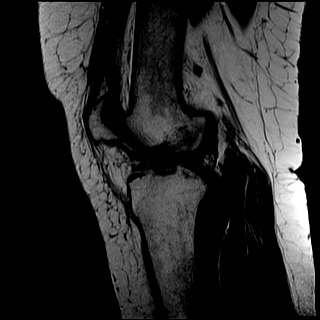
[im 22/26]
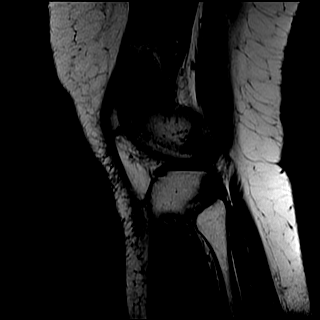
[im 26/26]
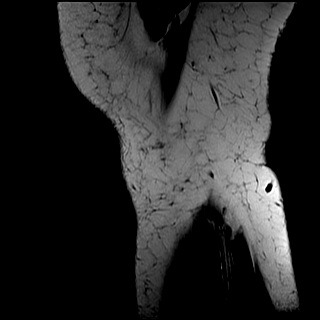

[Series 8: STIR · coronal · left · 4.0mm · 0.59mm/px · 6 of 30 slices shown]
[im 1/30]
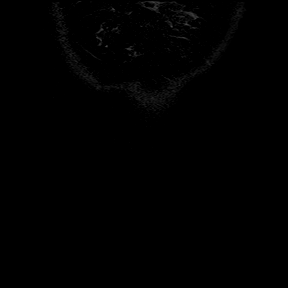
[im 4/30]
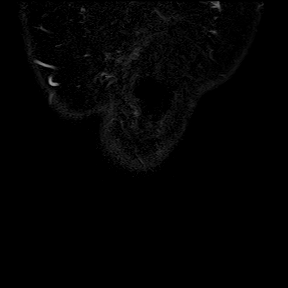
[im 8/30]
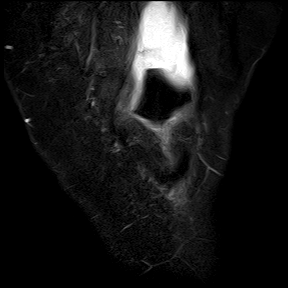
[im 11/30]
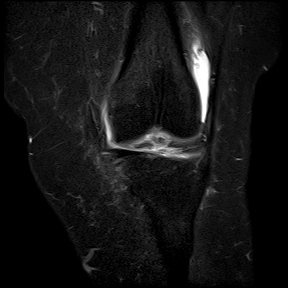
[im 19/30]
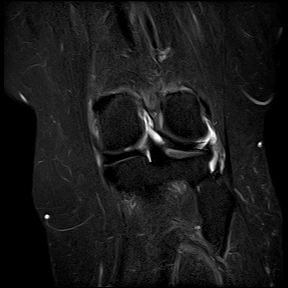
[im 22/30]
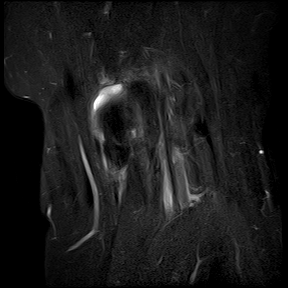

[Series 9: PD fat-sat · coronal · left · 4.0mm · 0.53mm/px · 9 of 30 slices shown (3 of 3)]
[im 1/30]
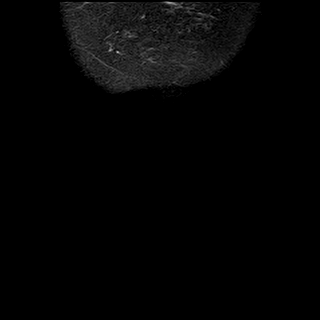
[im 4/30]
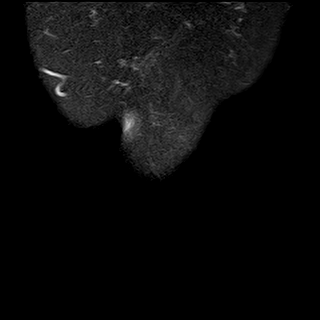
[im 8/30]
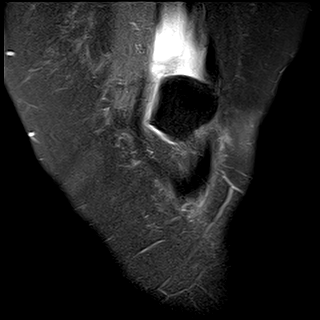
[im 11/30]
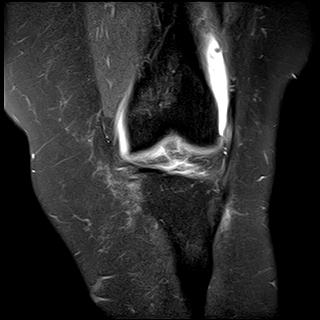
[im 15/30]
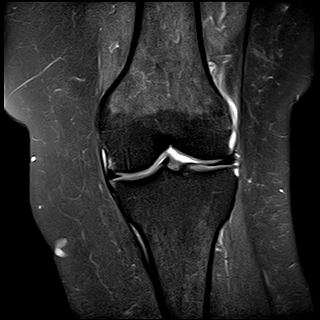
[im 19/30]
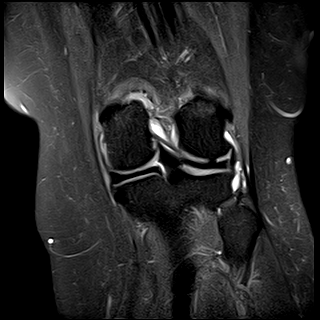
[im 22/30]
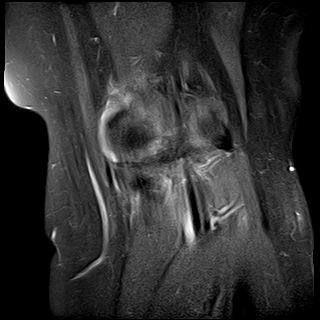
[im 26/30]
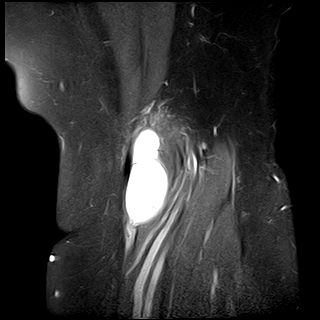
[im 30/30]
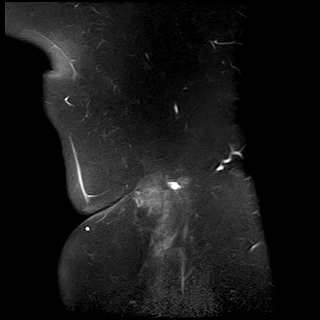

[37 of 40 positions shown; findings below may reference images not displayed]

FINDINGS: Examination is somewhat degraded due to excessive patient motion.  No definite meniscal tear is seen. Cruciate and collateral ligaments are intact, within normal limits in morphology and signal intensity.  There is grade 3 chondromalacia of the medial tibiofemoral articulation. Extensor mechanism is intact. Capsular attachments appear unremarkable. Bone marrow signal intensity is normal.  There is a small joint effusion. Mild edema within the Hoffa's fat pad is also identified.  There is also a 7 cm Baker's cyst.
IMPRESSION: 1. Somewhat degraded exam due to excessive patient motion. No definite meniscal tear.  

2. Grade 3 chondromalacia of the medial tibiofemoral articulation.  

3. Mild edema within the Hoffa's fat pad. 

4. Small joint effusion and a 7 cm Baker's cyst.

## 2021-09-09 ENCOUNTER — Encounter (INDEPENDENT_AMBULATORY_CARE_PROVIDER_SITE_OTHER): Payer: Self-pay | Admitting: Internal Medicine

## 2021-09-09 ENCOUNTER — Telehealth (INDEPENDENT_AMBULATORY_CARE_PROVIDER_SITE_OTHER): Payer: Self-pay | Admitting: Internal Medicine

## 2021-09-09 DIAGNOSIS — N926 Irregular menstruation, unspecified: Secondary | ICD-10-CM

## 2021-09-09 NOTE — Telephone Encounter (Signed)
Pt called stating she has been having abnormal menstrual cycles. Can you order some blood work and include a pregnancy test and hormone levels?

## 2021-09-09 NOTE — Telephone Encounter (Signed)
Patient will need a estradiol, progesterone level, LH, FSH, and a beta hCG

## 2021-09-16 ENCOUNTER — Other Ambulatory Visit (INDEPENDENT_AMBULATORY_CARE_PROVIDER_SITE_OTHER): Payer: Self-pay | Admitting: Internal Medicine

## 2021-09-16 DIAGNOSIS — N926 Irregular menstruation, unspecified: Secondary | ICD-10-CM

## 2021-09-30 ENCOUNTER — Other Ambulatory Visit (INDEPENDENT_AMBULATORY_CARE_PROVIDER_SITE_OTHER): Payer: Self-pay | Admitting: Internal Medicine
